# Patient Record
Sex: Female | Born: 1965
Health system: Southern US, Community
[De-identification: ages and names within clinical notes are randomized; demographics above are authoritative.]

## PROBLEM LIST (undated history)

## (undated) HISTORY — PX: WISDOM TOOTH EXTRACTION: SHX21

## (undated) HISTORY — PX: HERNIA REPAIR: SHX51

---

## 2011-08-01 ENCOUNTER — Ambulatory Visit (INDEPENDENT_AMBULATORY_CARE_PROVIDER_SITE_OTHER): Payer: BC Managed Care – PPO | Admitting: Medical

## 2011-08-01 ENCOUNTER — Encounter: Payer: Self-pay | Admitting: Medical

## 2011-08-01 DIAGNOSIS — R079 Chest pain, unspecified: Secondary | ICD-10-CM

## 2011-08-01 NOTE — Progress Notes (Signed)
Subjective:   HPI  Tara Armstrong is a 46 y.o. female who presents as a new patient today.  Moved from [place] 9 mo ago.    She is here for c/o chest pain.  Awoke in the middle of the night few nights ago with chest pain, in middle of chest.  Lasted few minutes, then she fell back to sleep.  Only happened this one time, no other chest pain.  Denies associated SOB, sweaty, nausea, numbness, tingling, radiating pain.   Was brief, went away and hasn't happened again.  She does notes that she had steak that night for supper.  Denies hx/o GERD, doesn't note heartburn in general.  Denies anxiety.    No other aggravating or relieving factors.    No other c/o.  The following portions of the patient's history were reviewed and updated as appropriate: allergies, current medications, past family history, past medical history, past social history, past surgical history and problem list.  No Known Allergies  Current Outpatient Prescriptions on File Prior to Visit  Medication Sig Dispense Refill  . calcium citrate-vitamin D (CITRACAL+D) 315-200 MG-UNIT per tablet Take 1 tablet by mouth 2 (two) times daily.        History reviewed. No pertinent past medical history.  Past Surgical History  Procedure Date  . Wisdom tooth extraction   . Hernia repair     R inguinal; L inguinal    Family History  Problem Relation Age of Onset  . Heart disease Mother 60    MI  . Parkinsonism Father   . Ulcers Brother     History   Social History  . Marital Status: Married    Spouse Name: N/A    Number of Children: N/A  . Years of Education: N/A   Occupational History  . homemaker    Social History Main Topics  . Smoking status: Never Smoker   . Smokeless tobacco: Not on file  . Alcohol Use: No  . Drug Use: No  . Sexually Active: Not on file   Other Topics Concern  . 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    Objective:   Physical Exam  General appearance: alert, no distress, WD/WN, lean white female Oral cavity: MMM, no lesions Neck: supple, no lymphadenopathy, no thyromegaly, no masses, no JVD Heart: RRR, normal S1, S2, no murmurs Lungs: CTA bilaterally, no wheezes, rhonchi, or rales Abdomen: +bs, soft, non tender, non distended, no masses, no hepatomegaly, no splenomegaly Pulses: 2+ symmetric, upper and lower extremities, normal cap refill Ext: no edema   Adult ECG Report  Indication: chest pain  Rate: 51bpm  Rhythm: sinus bradycardia  QRS Axis: 40 degrees  PR Interval:  QRS Duration: 88ms  QTc:  Conduction Disturbances: RSR in V1, V2  Other Abnormalities: none  Patient's cardiac risk factors are: none.  EKG comparison: none  Narrative Interpretation: sinus bradycardia, right ventricular conduction delay   Assessment and Plan :     Encounter Diagnosis  Name Primary?  . Chest pain Yes   Discussed her concerns.  Advised that her symptoms are nonspecific - could be heartburn, indigestion, GERD, cardiac, but she seems low risk for cardiac disease.  However, because she is approaching age 83yo and is interested  in baseline stress testing, she will discuss with husband, and would like to be referred for baseline cardiac screening.  She will return for fasting baseline labs.

## 2011-08-02 ENCOUNTER — Telehealth: Payer: Self-pay | Admitting: Internal Medicine

## 2011-08-02 NOTE — Telephone Encounter (Signed)
Pt would like to go ahead and do the baseline test screening. She wants to know what she needs to do to set that up

## 2011-08-04 ENCOUNTER — Other Ambulatory Visit: Payer: Self-pay | Admitting: Medical

## 2011-08-04 NOTE — Telephone Encounter (Signed)
Refer to Dr. Donnie Aho for baseline cardiac screening/stress test and recent chest pain.  Have her come in as discussed for baseline fasting screening labs too.  They should be in the computer (CMET, CBC w/ diff, Lipid, TSH, UA, CRP)

## 2011-08-05 NOTE — Telephone Encounter (Signed)
PATIENT WAS NOTIFIED OF HER APPOINTMENT WITH DR. TILLEY 08/12/11 @ 145PM. Tara Armstrong ALSO MADE HER APPOINTMENT TO SEE SHANE ON 08/12/11 @ 100 AM. CLS

## 2011-08-12 ENCOUNTER — Encounter: Payer: Self-pay | Admitting: Medical

## 2011-08-12 ENCOUNTER — Ambulatory Visit (INDEPENDENT_AMBULATORY_CARE_PROVIDER_SITE_OTHER): Payer: BC Managed Care – PPO | Admitting: Medical

## 2011-08-12 DIAGNOSIS — R079 Chest pain, unspecified: Secondary | ICD-10-CM

## 2011-08-12 LAB — COMPREHENSIVE METABOLIC PANEL
ALT: 9 U/L (ref 0–35)
CO2: 27 mEq/L (ref 19–32)
Calcium: 9.5 mg/dL (ref 8.4–10.5)
Chloride: 108 mEq/L (ref 96–112)
Creat: 0.98 mg/dL (ref 0.50–1.10)
Glucose, Bld: 80 mg/dL (ref 70–99)
Total Bilirubin: 0.4 mg/dL (ref 0.3–1.2)

## 2011-08-12 LAB — CBC WITH DIFFERENTIAL/PLATELET
Basophils Absolute: 0 10*3/uL (ref 0.0–0.1)
HCT: 40.5 % (ref 36.0–46.0)
Lymphocytes Relative: 34 % (ref 12–46)
Lymphs Abs: 2 10*3/uL (ref 0.7–4.0)
Monocytes Absolute: 0.5 10*3/uL (ref 0.1–1.0)
Neutro Abs: 3.4 10*3/uL (ref 1.7–7.7)
Platelets: 195 10*3/uL (ref 150–400)
RBC: 4.4 MIL/uL (ref 3.87–5.11)
RDW: 12.8 % (ref 11.5–15.5)
WBC: 5.9 10*3/uL (ref 4.0–10.5)

## 2011-08-12 LAB — HIGH SENSITIVITY CRP: CRP, High Sensitivity: 0.9 mg/L

## 2011-08-12 LAB — LIPID PANEL
HDL: 51 mg/dL (ref >39)
Total CHOL/HDL Ratio: 3.2 Ratio
Triglycerides: 103 mg/dL (ref <150)

## 2011-08-12 NOTE — Progress Notes (Signed)
Here for nurse visit labs only.  She has f/u with cardiology Dr. Donnie Aho today for screening.

## 2011-08-29 ENCOUNTER — Encounter: Payer: Self-pay | Admitting: Cardiology

## 2011-10-28 ENCOUNTER — Other Ambulatory Visit: Payer: Self-pay | Admitting: Obstetrics and Gynecology

## 2011-10-28 DIAGNOSIS — R928 Other abnormal and inconclusive findings on diagnostic imaging of breast: Secondary | ICD-10-CM

## 2011-10-30 ENCOUNTER — Ambulatory Visit
Admission: RE | Admit: 2011-10-30 | Discharge: 2011-10-30 | Disposition: A | Discharge status: Home / Self Care | Payer: BC Managed Care – PPO | Source: Ambulatory Visit | Attending: Obstetrics and Gynecology | Admitting: Obstetrics and Gynecology

## 2011-10-30 DIAGNOSIS — R928 Other abnormal and inconclusive findings on diagnostic imaging of breast: Secondary | ICD-10-CM

## 2012-11-04 ENCOUNTER — Other Ambulatory Visit: Payer: Self-pay | Admitting: Obstetrics and Gynecology

## 2012-11-04 DIAGNOSIS — R928 Other abnormal and inconclusive findings on diagnostic imaging of breast: Secondary | ICD-10-CM

## 2012-11-20 ENCOUNTER — Ambulatory Visit
Admission: RE | Admit: 2012-11-20 | Discharge: 2012-11-20 | Disposition: A | Discharge status: Home / Self Care | Payer: BC Managed Care – PPO | Source: Ambulatory Visit | Attending: Obstetrics and Gynecology | Admitting: Obstetrics and Gynecology

## 2012-11-20 DIAGNOSIS — R928 Other abnormal and inconclusive findings on diagnostic imaging of breast: Secondary | ICD-10-CM

## 2013-01-04 ENCOUNTER — Ambulatory Visit: Payer: Self-pay | Admitting: Family Medicine

## 2014-02-18 ENCOUNTER — Ambulatory Visit (INDEPENDENT_AMBULATORY_CARE_PROVIDER_SITE_OTHER): Payer: BC Managed Care – PPO | Admitting: Medical

## 2014-02-18 ENCOUNTER — Encounter: Payer: Self-pay | Admitting: Medical

## 2014-02-18 VITALS — BP 100/70 | HR 72 | Temp 98.5°F | Wt 178.0 lb

## 2014-02-18 DIAGNOSIS — J029 Acute pharyngitis, unspecified: Secondary | ICD-10-CM

## 2014-02-18 DIAGNOSIS — R059 Cough, unspecified: Secondary | ICD-10-CM

## 2014-02-18 DIAGNOSIS — R05 Cough: Secondary | ICD-10-CM

## 2014-02-18 DIAGNOSIS — J329 Chronic sinusitis, unspecified: Secondary | ICD-10-CM

## 2014-02-18 DIAGNOSIS — J40 Bronchitis, not specified as acute or chronic: Secondary | ICD-10-CM

## 2014-02-18 MED ORDER — HYDROCODONE-HOMATROPINE 5-1.5 MG/5ML PO SYRP: 5.0000 mL | ORAL_SOLUTION | Freq: Three times a day (TID) | ORAL | Status: DC | PRN

## 2014-02-18 MED ORDER — AZITHROMYCIN 250 MG PO TABS: ORAL_TABLET | ORAL | Status: DC

## 2014-02-18 NOTE — Patient Instructions (Signed)
  Thank you for giving me the opportunity to serve you today.    Your diagnosis today includes: Encounter Diagnoses  Name Primary?  . Cough Yes  . Sore throat   . Sinobronchitis      Specific recommendations today include:  Increase water intake  Begin Mucinex DM or Robitussin-DM for cough and congestion during the day  You may use Hycodan prescription cough syrup at bedtime or every 6 hours as long nausea or not driving since it can cause sedation  Begin Z-Pak antibiotic 5 days  Rest  If not much improved within the next 4-5 days let me know  Return if symptoms worsen or fail to improve.

## 2014-02-18 NOTE — Progress Notes (Signed)
Subjective:  Tara Armstrong is a 48 y.o. female who presents for upper respiratory symptoms.   Symptoms include sore throat, headache, cough, at a fever a few weeks ago but this resolved and she actually notes having these symptoms even since September.  Has had tender lymph nodes in her neck since yesterday. Headaches have returned again.  Initially in September had sore throat and headache, thought was a cold.  Would seem to improve but would flare up again.  Denies wheezing, shortness of breath, nausea vomiting diarrhea, sinus pressure no nasal congestion or drainage no ear pain.  Treatment to date: cough suppressants, decongestants and ibuprofen.  +sick contacts.   She does not smoke.   She does not have a history of bronchitis.   No other aggravating or relieving factors.  No other c/o.  The following portions of the patient's history were reviewed and updated as appropriate: allergies, current medications, past family history, past medical history, past social history, past surgical history and problem list.  History reviewed. No pertinent past medical history.  ROS as in subjective   Objective: BP 100/70 mmHg  Pulse 72  Temp(Src) 98.5 F (36.9 C) (Oral)  Wt 178 lb (80.74 kg)  SpO2 98%   General appearance: Alert, WD/WN, no distress,fatigued-appearing                             Skin: warm, no rash, no diaphoresis                           Head: no sinus tenderness                            Eyes: conjunctiva normal, corneas clear, PERRLA                            Ears: pearly TMs, external ear canals normal                          Nose: septum midline, turbinates swollen,right greater than left, with erythema and mucoid discharge             Mouth/throat: MMM, tongue normal, mild pharyngeal erythema, questionable right tonsillar exudate and mild tonsil swelling right                           Neck: supple, shoddy tender anterior nodes, no thyromegaly, nontender      Heart: RRR, normal S1, S2, no murmurs                         Lungs: +bronchial breath sounds, decreased bilat lower fields, but no obvious consolidation, no rhonchi, no wheezes, no rales                Extremities: no edema, nontender     Assessment: Encounter Diagnoses  Name Primary?  . Cough Yes  . Sore throat   . Sinobronchitis       Plan:  Medication orders today include: Zpak, hycodan syrup  Specific recommendations today include:  Increase water intake  Begin Mucinex DM or Robitussin-DM for cough and congestion during the day  You may use Hycodan prescription cough syrup at bedtime or every 6 hours as long nausea or  not driving since it can cause sedation  Begin Z-Pak antibiotic 5 days  Rest  If not much improved within the next 4-5 days let me know  F/u prn.

## 2014-05-02 LAB — HM COLONOSCOPY

## 2014-10-26 ENCOUNTER — Ambulatory Visit (INDEPENDENT_AMBULATORY_CARE_PROVIDER_SITE_OTHER): Payer: BC Managed Care – PPO | Admitting: Family Medicine

## 2014-10-26 ENCOUNTER — Ambulatory Visit
Admission: RE | Admit: 2014-10-26 | Discharge: 2014-10-26 | Disposition: A | Discharge status: Home / Self Care | Payer: BC Managed Care – PPO | Source: Ambulatory Visit | Attending: Family Medicine | Admitting: Family Medicine

## 2014-10-26 ENCOUNTER — Encounter: Payer: Self-pay | Admitting: Family Medicine

## 2014-10-26 VITALS — BP 118/70 | HR 60 | Temp 98.7°F | Ht 69.0 in | Wt 187.6 lb

## 2014-10-26 DIAGNOSIS — R053 Chronic cough: Secondary | ICD-10-CM

## 2014-10-26 DIAGNOSIS — R05 Cough: Secondary | ICD-10-CM | POA: Diagnosis not present

## 2014-10-26 DIAGNOSIS — J069 Acute upper respiratory infection, unspecified: Secondary | ICD-10-CM

## 2014-10-26 MED ORDER — ALBUTEROL SULFATE 108 (90 BASE) MCG/ACT IN AEPB: 2.0000 | INHALATION_SPRAY | Freq: Four times a day (QID) | RESPIRATORY_TRACT | Status: DC | PRN

## 2014-10-26 NOTE — Progress Notes (Signed)
Chief Complaint  Patient presents with  . Cough    since November. Her left ear also feels like it needs to pop. Non productive.    She gets a cough every winter, but this time it never went away. She has been coughing since November. Her family jokes that she is 'allergic to winter".  Starts like a tickle in the throat, like she needs to clear something.  Cough is nonproductive.  No increase cough with exercise, not related to eating.  Worse with talking and laughing.  Denies any heartburn. No associated runny nose, sneezing, eye symptoms.  Cough is not worse at night.  She started with a head cold in the last 2 days--ear plugging, sinus drainage,postnasal drainage and cough has gotten worse related to this.  Nasal mucus is slightly yellow.  She has been taking an OTC cold and cough medication, which helps some with the congestion. She had low grade fever of 99.9 yesterday.  No sick contacts.  PMH, PSH, SH were reviewed/updated  Outpatient Encounter Prescriptions as of 10/26/2014  Medication Sig  . calcium citrate-vitamin D (CITRACAL+D) 315-200 MG-UNIT per tablet Take 1 tablet by mouth 2 (two) times daily.  . [DISCONTINUED] azithromycin (ZITHROMAX) 250 MG tablet 2 tablets day 1, then 1 tablet days 2-4  . [DISCONTINUED] HYDROcodone-homatropine (HYCODAN) 5-1.5 MG/5ML syrup Take 5 mLs by mouth every 8 (eight) hours as needed for cough.   No facility-administered encounter medications on file as of 10/26/2014.   No Known Allergies  ROS:  Denies chills, nausea, vomiting, diarrhea, chest pain, heartburn, bleeding, bruising, rashes.  Denies dizziness. Some sinus headaches in the last couple of days.  Denies shortness of breath.  See HPI  PHYSICAL EXAM: BP 118/70 mmHg  Pulse 60  Temp(Src) 98.7 F (37.1 C) (Tympanic)  Ht 5\' 9"  (1.753 m)  Wt 187 lb 9.6 oz (85.095 kg)  BMI 27.69 kg/m2  LMP 11/19/2012 Well developed, pleasant female, with frequent dry, hacky cough during visit. HEENT: PERRL,  EOMI, conjunctiva clear. TM's and EAC's are normal. Nasal mucosa is mod-severely edematous, slightly pink, with clear mucus Mild erythema posteriorly in OP Neck: no lymphadenopathy, thyromegaly or mass Heart: regular rate and rhythm without murmur Lungs: clear--can only take normal breaths--deep breaths triggered coughing spell. No wheezing, rales or ronchi appreciated Skin: no rash Abdomen: soft, nontender Extremities: no edema Neuro: alert and oriented, cranial nerves intact, normal gait Psych: normal mood, affect, hygiene and grooming  ASSESSMENT/PLAN:  Chronic cough - dry cough since November, exacerbated by current URI - Plan: DG Chest 2 View, Albuterol Sulfate (PROAIR RESPICLICK) 108 (90 BASE) MCG/ACT AEPB, DISCONTINUED: Albuterol Sulfate (PROAIR RESPICLICK) 108 (90 BASE) MCG/ACT AEPB  Acute upper respiratory infection - supportive measures reviewed; no bacterial infection  Ddx of cough reveiwed, including RAD, GERD, infection, malignancy, allergies/PND.  [facility] at your convenience for chest x-ray [phone](301 or [address] We are recommending this due to the length of cough that you have had. You clearly also have a cold right now, that is making things worse. Ultimately, I think you might need pulmonary function tests (we do in the office) but now when you have a cold.  This will help look for reactive airway disease/asthma Drink plenty of water. Use Guaifenesin (expectorant found in Mucinex, Robitussin products) to loosen up the mucus/phlegm in the sinuses, throat and chest. Use decongestants (such as pseudoephedrine or phenylephrine) to dry up the drainage and help with sinus pain/pressure. Dextromethorphan is a cough suppressant (found  in many OTC medications, "DM", and is found separately in Delsym syrup).  We discussed Tessalon Perles (benzonatate) as another cough suppressant that is prescription only--very good for dry hacky coughs.  Let us know if you need  this.  For the chronic cough, the differential diagnoses we discussed included allergies, asthma and reflux. We discussed treating all three, and then backing down on the things we think are less likely contributing. The medications would include zyrtec/claritin/allegra (plain or the D versions especially if you are having ongoing congestion)--this is for cold/allergy symptoms. Prilosec OTC or Nexium 24 hr (these are over-the-counter medications for acid reflux). And for the asthma possibility we either use an albuterol inhaler, vs using oral steroids (which have more side effects).  Today we are prescribing albuterol (inhaler) to use every 6 hours as needed for dry cough/tightness/shortness of breath.

## 2014-10-26 NOTE — Patient Instructions (Signed)
  Go [facility] at your convenience for chest x-ray [phone] or [phone]). We are recommending this due to the length of cough that you have had. You clearly also have a cold right now, that is making things worse. Ultimately, I think you might need pulmonary function tests (we do in the office) but now when you have a cold.  This will help look for reactive airway disease/asthma Drink plenty of water. Use Guaifenesin (expectorant found in Mucinex, Robitussin products) to loosen up the mucus/phlegm in the sinuses, throat and chest. Use decongestants (such as pseudoephedrine or phenylephrine) to dry up the drainage and help with sinus pain/pressure. Dextromethorphan is a cough suppressant (found in many OTC medications, "DM", and is found separately in Delsym syrup).  We discussed Tessalon Perles (benzonatate) as another cough suppressant that is prescription only--very good for dry hacky coughs.  Let us know if you need this.  For the chronic cough, the differential diagnoses we discussed included allergies, asthma and reflux. We discussed treating all three, and then backing down on the things we think are less likely contributing. The medications would include zyrtec/claritin/allegra (plain or the D versions especially if you are having ongoing congestion)--this is for cold/allergy symptoms. Prilosec OTC or Nexium 24 hr (these are over-the-counter medications for acid reflux). And for the asthma possibility we either use an albuterol inhaler, vs using oral steroids (which have more side effects).  Today we are prescribing albuterol (inhaler) to use every 6 hours as needed for dry cough/tightness/shortness of breath.

## 2014-11-09 ENCOUNTER — Encounter: Payer: Self-pay | Admitting: Family Medicine

## 2014-11-09 ENCOUNTER — Ambulatory Visit (INDEPENDENT_AMBULATORY_CARE_PROVIDER_SITE_OTHER): Payer: BC Managed Care – PPO | Admitting: Family Medicine

## 2014-11-09 VITALS — BP 108/72 | HR 64 | Ht 69.0 in | Wt 185.4 lb

## 2014-11-09 DIAGNOSIS — R05 Cough: Secondary | ICD-10-CM

## 2014-11-09 DIAGNOSIS — H9192 Unspecified hearing loss, left ear: Secondary | ICD-10-CM | POA: Diagnosis not present

## 2014-11-09 DIAGNOSIS — H6982 Other specified disorders of Eustachian tube, left ear: Secondary | ICD-10-CM | POA: Diagnosis not present

## 2014-11-09 DIAGNOSIS — R059 Cough, unspecified: Secondary | ICD-10-CM

## 2014-11-09 DIAGNOSIS — H6992 Unspecified Eustachian tube disorder, left ear: Secondary | ICD-10-CM

## 2014-11-09 DIAGNOSIS — J309 Allergic rhinitis, unspecified: Secondary | ICD-10-CM

## 2014-11-09 MED ORDER — FLUTICASONE PROPIONATE 50 MCG/ACT NA SUSP: 2.0000 | Freq: Every day | NASAL | Status: DC

## 2014-11-09 MED ORDER — METHYLPREDNISOLONE 4 MG PO TBPK: ORAL_TABLET | ORAL | Status: DC

## 2014-11-09 NOTE — Patient Instructions (Signed)
   Take the steroids as directed. Start taking flonase--2 sprays into each nostril once daily.  Use gentle sniffs only.   I recommend taking an antihistamine such as claritin, zyrtec or allegra daily. You may continue to use the Tylenol Cold and sinus for now, short-term.  Ultimately, if you continue to need additional decongestant (along with the flonase and claritin), then switch to a "D" version of the antihistamine (ie Claritin D, Zyrtec-D).   Call us if your hearing in the left ear doesn't not improve, so we can refer you to ENT for further evaluation.  If you develop ear pain, fever, please return here first for re-evaluation.

## 2014-11-09 NOTE — Progress Notes (Signed)
Chief Complaint  Patient presents with  . Cough    follow up on cough, getting better.   She was seen 2 weeks ago with complaint of chronic cough since November, but also had acute URI symptoms at that time.   The more chronic cough was nonproductive, started as a tickle in throat, and was worse with talking and laughing, not with exercise.  She had CXR which showed: Subsegmental atelectasis in the right infrahilar region possibly reflecting acute bronchitis. There is no alveolar pneumonia nor CHF.  She was given albuterol inhaler at last visit. We also discussed reflux, allergies, and various treatments for all of these conditions.  She presents today to follow up on the cough.  She used the inhaler a few times, and it didn't seem to help with the cough at all.  She went back to taking Hycodan syrup she had left at home.  She has also been taking Tylenol Cold and Cough, which helped with the head congestion.  She reports her cough is better.  She is aware of more postnasal drainage than at last visit, runny nose/sniffling, moreso than 2 weeks ago.  She still cannot hear out of her left ear.  Denies pain.  Sounds echoey; not popping.  She recalls having had a similar problem in the past, and once her ear popped the hearing returned.  This is lasting longer, ear hasn't popped. She denies any ear pain.  She last used Tylenol Cold and Sinus 2 days ago, as she started feeling better. Mucinex upset her stomach so wasn't able to take it. Never tried any PPI/reflux treatment. Denies heartburn.  PMH, PSH, SH reviewed  Outpatient Encounter Prescriptions as of 11/09/2014  Medication Sig Note  . calcium citrate-vitamin D (CITRACAL+D) 315-200 MG-UNIT per tablet Take 1 tablet by mouth 2 (two) times daily.   . Albuterol Sulfate (PROAIR RESPICLICK) 108 (90 BASE) MCG/ACT AEPB Inhale 2 puffs into the lungs every 6 (six) hours as needed (cough/wheezing/shortness of breath). (Patient not taking: Reported on  11/09/2014) 11/09/2014: Used 3 times and didn't notice improvement   No facility-administered encounter medications on file as of 11/09/2014.   No Known Allergies  ROS:  No fever, chills, nausea, vomiting, diarrhea, shortness of breath. No headache, dizziness, chest pain, ear pain, diarrhea, bleeding, bruising, rash or other complaints.  See HPI.  PHYSICAL EXAM: BP 108/72 mmHg  Pulse 64  Ht 5\' 9"  (1.753 m)  Wt 185 lb 6.4 oz (84.097 kg)  BMI 27.37 kg/m2  LMP 11/19/2012  Well developed, pleasant female with frequent sniffling, and only occasional dry cough HEENT: PERRL, EOMI, conjunctiva clear. TM's and EAC's normal (if anything, slight effusion on the RIGHT; completely normal on the left).  Nasal mucosa is mod-severely edematous, with clear mucus.  OP is clear. Decreased hearing on the left (can barely hear fingers rubbing; normal on the right). Neck: no lymphadenopathy or mass Heart: regular rate and rhythm without murmur Lungs: clear bilaterally Neuro: alert and oriented, cranial nerves intact, normal strength, gait Psych: normal mood, affect, hygiene and grooming Skin: no rash  ASSESSMENT/PLAN:  Left ear hearing loss - related to ETD vs viral. treat with steroids--f/u with ENT if hearing doesn't improve.  Risks/side effects reviewed. - Plan: methylPREDNISolone (MEDROL DOSEPAK) 4 MG TBPK tablet  Allergic rhinitis, unspecified allergic rhinitis type - discussed proper use of nasal steroids, antihistamines - Plan: methylPREDNISolone (MEDROL DOSEPAK) 4 MG TBPK tablet, fluticasone (FLONASE) 50 MCG/ACT nasal spray  Eustachian tube dysfunction, left  Cough -  improving   Left hearing loss--suspect is related to ETD but cannot r/o viral cause.  Treat with steroid course. Likely has allergies contributing to her symptoms (PND, ETD and cough).  Start Flonase and daily antihistamine. Continue decongestants prn.  Risks/side effects reviewed.  Refer to ENT if hearing does not return  normally.    Take the steroids as directed. Start taking flonase--2 sprays into each nostril once daily.  Use gentle sniffs only.   I recommend taking an antihistamine such as claritin, zyrtec or allegra daily. You may continue to use the Tylenol Cold and sinus for now, short-term.  Ultimately, if you continue to need additional decongestant (along with the flonase and claritin), then switch to a "D" version of the antihistamine (ie Claritin D, Zyrtec-D).   Call us if your hearing in the left ear doesn't not improve, so we can refer you to ENT for further evaluation.  If you develop ear pain, fever, please return here first for re-evaluation.

## 2014-11-14 ENCOUNTER — Telehealth: Payer: Self-pay | Admitting: Family Medicine

## 2014-11-14 NOTE — Telephone Encounter (Signed)
Okay to refer to ENT for hearing loss 

## 2014-11-14 NOTE — Telephone Encounter (Signed)
Please call patient She states Dr. Lynelle Doctor saw her recently and told her to call back for ENT referral for hearing problems L ear

## 2014-11-15 NOTE — Telephone Encounter (Signed)
Faxed referral to [facility]

## 2015-08-01 ENCOUNTER — Ambulatory Visit (INDEPENDENT_AMBULATORY_CARE_PROVIDER_SITE_OTHER): Payer: BC Managed Care – PPO | Admitting: Podiatry

## 2015-08-01 ENCOUNTER — Ambulatory Visit (INDEPENDENT_AMBULATORY_CARE_PROVIDER_SITE_OTHER): Payer: BC Managed Care – PPO

## 2015-08-01 ENCOUNTER — Encounter: Payer: Self-pay | Admitting: Podiatry

## 2015-08-01 VITALS — BP 125/72 | HR 64 | Resp 16

## 2015-08-01 DIAGNOSIS — M7662 Achilles tendinitis, left leg: Secondary | ICD-10-CM

## 2015-08-01 DIAGNOSIS — M722 Plantar fascial fibromatosis: Secondary | ICD-10-CM

## 2015-08-01 MED ORDER — MELOXICAM 15 MG PO TABS: 15.0000 mg | ORAL_TABLET | Freq: Every day | ORAL | Status: DC

## 2015-08-01 MED ORDER — METHYLPREDNISOLONE 4 MG PO TBPK: ORAL_TABLET | ORAL | Status: DC

## 2015-08-01 NOTE — Progress Notes (Signed)
   Subjective:    Patient ID: Tara Armstrong, female    DOB: 1965/10/26, 50 y.o.   MRN: [id number]  HPI: She presents today with a 2 week history of pain to her left posterior heel. She relates some pain to the plantar heel but not much. She states that this started as she was doing excessive walking while at school. She is a Research officer, trade unionkindergarten instructor. She's tried icing and ibuprofen with some help.    Review of Systems  Musculoskeletal: Positive for gait problem.  Allergic/Immunologic: Positive for environmental allergies.  All other systems reviewed and are negative.      Objective:   Physical Exam: Vital signs are stable she's alert and oriented 3. Pulses are strongly palpable. Vital signs are stable alert and oriented 3. Neurologic sensorium is intact. Deep tendon reflexes are intact and muscle strength is normal. Orthopedic evaluation demonstrates pain to palpation medial calcaneal tubercle of the left heel as well as pain on palpation of the insertion site of the Achilles tendon left heel. Radiographs taken in the office today do not demonstrate any type of osseus abnormalities other than plantar distally oriented calcaneal heel spur and a soft tissue decrease in density at the plantar fascial calcaneal insertion site. I see no signs of Achilles tendinitis at this point. Cutaneous evaluation demonstrates supple well-hydrated cutis no erythema edema cellulitis drainage or odor.        Assessment & Plan:  Plantar fasciitis resulting in Achilles tendinitis left foot.  Plan: Start her on a Medrol Dosepak followed by meloxicam. Injected her left plantar heel today with Kenalog and local anesthetic. Placed her in a plantar fascial brace and a night splint. We discussed appropriate's shoe gear modifications ice therapy. I will follow-up with her in 1 month. We may need to inject her posterior heel left next visit

## 2015-08-01 NOTE — Patient Instructions (Addendum)

## 2015-08-03 ENCOUNTER — Ambulatory Visit: Payer: BC Managed Care – PPO | Admitting: Podiatry

## 2015-08-29 ENCOUNTER — Ambulatory Visit: Payer: BC Managed Care – PPO | Admitting: Podiatry

## 2015-10-05 ENCOUNTER — Ambulatory Visit (INDEPENDENT_AMBULATORY_CARE_PROVIDER_SITE_OTHER): Payer: BC Managed Care – PPO | Admitting: Podiatry

## 2015-10-05 ENCOUNTER — Encounter: Payer: Self-pay | Admitting: Podiatry

## 2015-10-05 VITALS — BP 123/86 | HR 69 | Resp 12

## 2015-10-05 DIAGNOSIS — M722 Plantar fascial fibromatosis: Secondary | ICD-10-CM

## 2015-10-05 MED ORDER — MELOXICAM 15 MG PO TABS: 15.0000 mg | ORAL_TABLET | Freq: Every day | ORAL | Status: DC

## 2015-10-05 NOTE — Progress Notes (Signed)
She persisted A for follow-up of her left heel pain she states that she is approximately 70-80% improved but is still tender. She states that she really continues to take the meloxicam a regular basis.  Objective: Vital signs are stable she is alert and oriented 3. Pulses are palpable. Neurologic sensorium is intact. She has pain on palpation medial tubercle of the left heel but minimally so.  Assessment: Residual plantar fasciitis and heel.  Plan: I reinjected her left heel today with Kenalog and local anesthetic encouraged her to continue to take her Alesse Camel regular basis and continue all other conservative therapies I will follow-up with her in 4-6 weeks for reevaluation should this be necessary. Orthotics will be her next option.

## 2015-11-16 ENCOUNTER — Ambulatory Visit: Payer: BC Managed Care – PPO | Admitting: Podiatry

## 2015-11-16 ENCOUNTER — Encounter: Payer: Self-pay | Admitting: Family Medicine

## 2015-11-16 ENCOUNTER — Ambulatory Visit (INDEPENDENT_AMBULATORY_CARE_PROVIDER_SITE_OTHER): Payer: BC Managed Care – PPO | Admitting: Family Medicine

## 2015-11-16 VITALS — BP 110/70 | HR 72 | Ht 69.0 in | Wt 185.2 lb

## 2015-11-16 DIAGNOSIS — J302 Other seasonal allergic rhinitis: Secondary | ICD-10-CM

## 2015-11-16 DIAGNOSIS — R1310 Dysphagia, unspecified: Secondary | ICD-10-CM

## 2015-11-16 DIAGNOSIS — R0789 Other chest pain: Secondary | ICD-10-CM | POA: Diagnosis not present

## 2015-11-16 MED ORDER — FLUTICASONE PROPIONATE 50 MCG/ACT NA SUSP: 1.0000 | Freq: Every day | NASAL | 11 refills | Status: DC

## 2015-11-16 MED ORDER — CETIRIZINE HCL 10 MG PO TABS: 10.0000 mg | ORAL_TABLET | Freq: Every day | ORAL | 11 refills | Status: DC

## 2015-11-16 MED ORDER — OMEPRAZOLE MAGNESIUM 20 MG PO TBEC: 20.0000 mg | DELAYED_RELEASE_TABLET | Freq: Every day | ORAL | 0 refills | Status: DC

## 2015-11-16 NOTE — Patient Instructions (Signed)
     Chest pain--sounds like esophageal spasm given occurring while eating. Since you are currently taking NSAIDs (mobic), let's try prilosec OTC daily for 2 weeks in case this is contributing. You may also have component of postnasal drainage hanging things up in the chest as well. Restart zyrtec and/or flonase, consider mucinex  Refer for Barium swallow to evaluate for dysmotility or spasm.--Suzette Battiest will schedule. May need referral to GI if not improving despite these measures.

## 2015-11-16 NOTE — Progress Notes (Signed)
Chief Complaint  Patient presents with  . Heartburn    has been experiencing for about 2 weeks and was really bad yesterday.     2 weeks ago she had a few episodes of discomfort in her mid chest. This occurred while eating--thought maybe she ate too fast. She started slowing down, smaller portions, chewing food better, but had another episode, also while eating.  She felt like food was getting hung up, but was able to swallow it.  Yesterday she had a more intense pain between her breasts, and spread across the whole chest, bilaterally.  Also occurred while eating--rice and enchilada (not spicy). Can't recall the foods she ate with the other episodes. Had pizza today without a problem.  Yesterday the pain lasted the longest--a few minutes. Was able to talk through it, tried to cough, didn't help. Otherwise hasn't had any problems with heartburn, belching. Denies change in diet--no change in caffeine, spicy foods, chocolate, peppermint.  Allergies--requesting refills on zyrtec and flonase (has rx card, so benefits from rx for these OTC medications).  She uses them seasonally with good result (not currently taking). Denies current allergy symptoms.  She saw Dr. Donnie Aho in 2013 for chest pain. She had a stress test in 08/2011, normal (see Media section).  Denies other chest pain, except as stated above.  No shortness of breath or exertional symptoms.  Less exercise recently due to PF and achilles tendinitis. No cough, wheezing.   PMH, PSH, SH reviewed  Outpatient Encounter Prescriptions as of 11/16/2015  Medication Sig Note  . Calcium Carbonate-Vitamin D3 (CALCIUM 600-D) 600-400 MG-UNIT TABS Take 2 tablets by mouth daily.   . meloxicam (MOBIC) 15 MG tablet Take 1 tablet (15 mg total) by mouth daily.   Marland Kitchen aspirin 81 MG tablet Take 81 mg by mouth daily.   . cetirizine (ZYRTEC ALLERGY) 10 MG tablet Take 1 tablet (10 mg total) by mouth daily.   . fluticasone (FLONASE) 50 MCG/ACT nasal spray Place  1-2 sprays into both nostrils daily.   Marland Kitchen omeprazole (PRILOSEC OTC) 20 MG tablet Take 1 tablet (20 mg total) by mouth daily.   . [DISCONTINUED] Cetirizine HCl (ZYRTEC ALLERGY PO) Take by mouth.   . [DISCONTINUED] fluticasone (FLONASE) 50 MCG/ACT nasal spray Place 1 spray into both nostrils daily. 11/16/2015: Uses seasonally  . [DISCONTINUED] meloxicam (MOBIC) 15 MG tablet Take 1 tablet (15 mg total) by mouth daily.   . [DISCONTINUED] methylPREDNISolone (MEDROL) 4 MG TBPK tablet Tapering 6 day dose pack   . [DISCONTINUED] omeprazole (PRILOSEC OTC) 20 MG tablet Take 1 tablet (20 mg total) by mouth daily.    No facility-administered encounter medications on file as of 11/16/2015.    (prilosec rx'd today, not prior to visit)  No Known Allergies  ROS: no fever, chills, headaches, dizziness, cough, shortness of breath, URI symptoms, allergies not flaring.  No nausea, vomiting. Bowels are fairly normal, not quite as regular recently. No bleeding, bruising, rash, mood changes or other concerns, except as noted in HPI  PHYSICAL EXAM: BP 110/70 (BP Location: Left Arm, Patient Position: Sitting, Cuff Size: Normal)   Pulse 72   Ht  (1.753 m)   Wt 185 lb 3.2 oz (84 kg)   LMP 11/19/2012   BMI 27.35 kg/m   Well developed, well-appearing female in no distress. She appears comfortable, and currently denies discomfort HEENT: PERRL, EOMI, conjunctiva and sclera are clear. Nasal mucosa is mod edematous, no erythema or purulence OP with thick white mucus noted posteriorly  Sinuses nontender Neck: no lymphadenopathy or mass Heart: regular rate and rhythm, no murmur Chest wall nontender Lungs: clear bilaterally Abdomen: soft, nontender, no mass or organomegaly Extremiites: no edema Skin: normal turgor, no rash Psych: normal mood, affect, hygiene and grooming Neuro: alert and oriented, normal cranial nerves, strength, gait  EKG:  Sinus brady. Poss old septal infarct. No acute abnormalities Review of  chart--see notes of EKG at visit with Shane in 08/01/11, but cannot find actual EKG for comparison.   ASSESSMENT/PLAN:  Other chest pain - with eating--suspect esophageal spasm or dysmotility. PN and/or esophagitis can be a factor - Plan: DG Esophagus, EKG 12-Lead  Other seasonal allergic rhinitis - asymptomatic, but evidence of inflammation and drainage on exam.  restart meds - Plan: cetirizine (ZYRTEC ALLERGY) 10 MG tablet, fluticasone (FLONASE) 50 MCG/ACT nasal spray  Dysphagia - Plan: DG Esophagus, EKG 12-Lead, omeprazole (PRILOSEC OTC) 20 MG tablet, DISCONTINUED: omeprazole (PRILOSEC OTC) 20 MG tablet   Chest pain--sounds like esophageal spasm given occurring while eating. She is currently taking NSAIDs Trial of prilosec OTC daily for 2 weeks to cover for any contributing factor of esophagitis. May have component of postnasal drainage hanging things up in the chest as well. Restart zyrtec and/or flonase, consider mucinex  Refer for Barium swallow to evaluate for dysmotility or spasm. May need referral to GI if not improving despite these measures.

## 2015-11-20 ENCOUNTER — Ambulatory Visit
Admission: RE | Admit: 2015-11-20 | Discharge: 2015-11-20 | Disposition: A | Discharge status: Home / Self Care | Payer: BC Managed Care – PPO | Source: Ambulatory Visit | Attending: Family Medicine | Admitting: Family Medicine

## 2015-11-20 DIAGNOSIS — R1310 Dysphagia, unspecified: Secondary | ICD-10-CM

## 2015-11-20 DIAGNOSIS — R0789 Other chest pain: Secondary | ICD-10-CM

## 2015-12-12 ENCOUNTER — Ambulatory Visit (INDEPENDENT_AMBULATORY_CARE_PROVIDER_SITE_OTHER): Payer: BC Managed Care – PPO | Admitting: Podiatry

## 2015-12-12 ENCOUNTER — Encounter: Payer: Self-pay | Admitting: Podiatry

## 2015-12-12 DIAGNOSIS — M7662 Achilles tendinitis, left leg: Secondary | ICD-10-CM

## 2015-12-13 NOTE — Progress Notes (Signed)
She presents today for follow-up of Achilles tendinitis of the left lower extremity. She states that the Achilles seems to be constant area of irritation.  Objective: Vital signs are stable she is alert and oriented 3. Pulses are palpable. She still has some tenderness on palpation of the Achilles tendon left. No open lesions or wounds. Pulses are palpable pain.  Assessment: Chronic intractable Achilles tendinitis left  Plan: We are consented her to physical therapy and we also scanned her for orthotics today. I will follow-up with her in 1 month.

## 2016-01-02 ENCOUNTER — Encounter: Payer: Self-pay | Admitting: Podiatry

## 2016-01-02 ENCOUNTER — Ambulatory Visit (INDEPENDENT_AMBULATORY_CARE_PROVIDER_SITE_OTHER): Payer: BC Managed Care – PPO | Admitting: Podiatry

## 2016-01-02 DIAGNOSIS — M7662 Achilles tendinitis, left leg: Secondary | ICD-10-CM

## 2016-01-02 DIAGNOSIS — M722 Plantar fascial fibromatosis: Secondary | ICD-10-CM

## 2016-01-02 NOTE — Progress Notes (Signed)
She presented today to pick up her orthotics. She is provided with both oral and written home-going instructions for the care and use of the orthotics. We will follow-up with her 1 month to reevaluate the Achilles tendinitis and plantar fasciitis.

## 2016-01-02 NOTE — Patient Instructions (Signed)

## 2016-01-30 ENCOUNTER — Ambulatory Visit: Payer: BC Managed Care – PPO | Admitting: Podiatry

## 2016-02-14 ENCOUNTER — Telehealth: Payer: Self-pay | Admitting: *Deleted

## 2016-02-14 DIAGNOSIS — D6851 Activated protein C resistance: Secondary | ICD-10-CM | POA: Insufficient documentation

## 2016-02-14 NOTE — Telephone Encounter (Signed)
I received her labs and put them in your folder.

## 2016-02-14 NOTE — Telephone Encounter (Signed)
Not sure why GYN wouldn't refer. Done (in epic). Please have labs scanned (or fax) so they have access.  Thanks

## 2016-02-14 NOTE — Telephone Encounter (Signed)
Called [facility] to obtain lab results. They will fax.

## 2016-02-14 NOTE — Telephone Encounter (Signed)
Patient was seen at Dr.Adkins office (GYN) had labs drawn, had Factor V Leiden done (runs in family). Asking for a referral to a hematologist to follow up. I asked if they were going to do one and she said no. Is this ok?

## 2016-02-14 NOTE — Telephone Encounter (Signed)
We need copy of lab results before we can do the referral.

## 2016-02-20 ENCOUNTER — Encounter: Payer: Self-pay | Admitting: Oncology

## 2016-02-20 ENCOUNTER — Encounter: Payer: Self-pay | Admitting: Family Medicine

## 2016-02-20 ENCOUNTER — Telehealth: Payer: Self-pay | Admitting: Oncology

## 2016-02-20 NOTE — Telephone Encounter (Signed)
Pt confirmed appt, (will be out of country in [place]) verified demo and insurance, mailed pt letter, and in [employer] referring provider appt date/time

## 2016-03-12 ENCOUNTER — Ambulatory Visit (HOSPITAL_BASED_OUTPATIENT_CLINIC_OR_DEPARTMENT_OTHER): Payer: BC Managed Care – PPO | Admitting: Oncology

## 2016-03-12 VITALS — BP 127/74 | HR 54 | Temp 97.6°F | Resp 14 | Ht 69.0 in | Wt 185.0 lb

## 2016-03-12 DIAGNOSIS — Z8489 Family history of other specified conditions: Secondary | ICD-10-CM | POA: Diagnosis not present

## 2016-03-12 DIAGNOSIS — D6851 Activated protein C resistance: Secondary | ICD-10-CM | POA: Diagnosis not present

## 2016-03-12 DIAGNOSIS — Z85828 Personal history of other malignant neoplasm of skin: Secondary | ICD-10-CM

## 2016-03-12 DIAGNOSIS — Z808 Family history of malignant neoplasm of other organs or systems: Secondary | ICD-10-CM | POA: Diagnosis not present

## 2016-03-12 NOTE — Progress Notes (Signed)
Reason for Referral: Factor V Leiden mutation.   HPI: 50 year old woman currently of [place] for the last 5 and half years but she is originally from [place]. She is a rather healthy woman with history of allergies but no other significant comorbid conditions. Her brother was diagnosed with a deep pain thrombosis and was found to have factor V Leiden mutation her mother was tested for the same mutation and she was positive as well. Based on these findings she was tested for it and the presumably she was told that she has heterozygous factor V Leiden mutation. I was asked to comment about these findings. She never had any blood clots in the past. She denied any deep vein thrombosis, pulmonary embolism or phlebitis. She had 4 pregnancies including one miscarriage and 3 pregnancies were carried to term. No postpartum complications or thrombosis. She had a hernia surgery and multiple long flights without any thrombosis episodes. She has really no complaints at this time. She denied any smoking.  She does not report any headaches, blurry vision, syncope or seizures. She does not report any fevers, chills or sweats. She does not report any chest pain, palpitation, orthopnea or leg edema. She does not report any cough, wheezing or hemoptysis. She does not report any nausea, vomiting or abdominal pain. She does not report any frequency urgency or hesitancy. She does not report skeletal complaints. Remaining review of systems unremarkable.    Her past medical history significant for seasonal allergies. She did have non-melanotic skin cancer removed from her scalp.   Past Surgical History:  Procedure Laterality Date  . HERNIA REPAIR     R inguinal; L inguinal  . WISDOM TOOTH EXTRACTION    :   Current Outpatient Prescriptions:  .  aspirin 81 MG tablet, Take 81 mg by mouth daily., Disp: , Rfl:  .  Calcium Carbonate-Vitamin D3 (CALCIUM 600-D) 600-400 MG-UNIT TABS, Take 2 tablets by mouth daily., Disp: ,  Rfl:  .  cetirizine (ZYRTEC ALLERGY) 10 MG tablet, Take 1 tablet (10 mg total) by mouth daily., Disp: 30 tablet, Rfl: 11 .  fluticasone (FLONASE) 50 MCG/ACT nasal spray, Place 1-2 sprays into both nostrils daily., Disp: 16 g, Rfl: 11 .  meloxicam (MOBIC) 15 MG tablet, Take 1 tablet (15 mg total) by mouth daily., Disp: 30 tablet, Rfl: 3:  No Known Allergies:  Family History  Problem Relation Age of Onset  . Heart disease Mother 1270    MI, has stents x2  . Parkinsonism Father   . Ulcers Brother   . Tongue cancer Brother   :  Social History   Social History  . Marital status: Married    Spouse name: N/A  . Number of children: N/A  . Years of education: N/A   Occupational History  . Teacher's assistant    Social History Main Topics  . Smoking status: Never Smoker  . Smokeless tobacco: Never Used  . Alcohol use 0.0 oz/week     Comment: rarely  . Drug use: No  . Sexual activity: Not on file   Other Topics Concern  . Not on file   Social History Narrative   Architectural technologistteacher's assistant at [employer], lives with husband and 3 children, exercise 5 days per week on elliptical and walking the dogs  :  Pertinent items are noted in HPI.  Exam: Blood pressure 127/74, pulse (!) 54, temperature 97.6 F (36.4 C), temperature source Oral, resp. rate 14, height 5\' 9"  (1.753 m), weight 185 lb (83.9  kg), last menstrual period 11/19/2012, SpO2 100 %.  ECOG 0 General appearance: alert and cooperative appeared without distress. Head: Normocephalic, without obvious abnormality Throat: lips, mucosa, and tongue normal; teeth and gums normal Neck: no adenopathy Back: negative Resp: clear to auscultation bilaterally Chest wall: no tenderness Cardio: regular rate and rhythm, S1, S2 normal, no murmur, click, rub or gallop GI: soft, non-tender; bowel sounds normal; no masses,  no organomegaly Extremities: extremities normal, atraumatic, no cyanosis or edema Pulses: 2+ and symmetric Skin: Skin  color, texture, turgor normal. No rashes or lesions Lymph nodes: Cervical, supraclavicular, and axillary nodes normal.  CBC    Component Value Date/Time   WBC 5.9 08/12/2011 1006   RBC 4.40 08/12/2011 1006   HGB 12.8 08/12/2011 1006   HCT 40.5 08/12/2011 1006   PLT 195 08/12/2011 1006   MCV 92.0 08/12/2011 1006   MCH 29.1 08/12/2011 1006   MCHC 31.6 08/12/2011 1006   RDW 12.8 08/12/2011 1006   LYMPHSABS 2.0 08/12/2011 1006   MONOABS 0.5 08/12/2011 1006   EOSABS 0.0 08/12/2011 1006   BASOSABS 0.0 08/12/2011 1006     Chemistry      Component Value Date/Time   NA 142 08/12/2011 1006   K 4.5 08/12/2011 1006   CL 108 08/12/2011 1006   CO2 27 08/12/2011 1006   BUN 23 08/12/2011 1006   CREATININE 0.98 08/12/2011 1006      Component Value Date/Time   CALCIUM 9.5 08/12/2011 1006   ALKPHOS 32 (L) 08/12/2011 1006   AST 14 08/12/2011 1006   ALT 9 08/12/2011 1006   BILITOT 0.4 08/12/2011 1006       Assessment and Plan:   50 year old woman with the following issues:  1. Heterozygous factor V Leiden mutation detected in an asymptomatic individual with family history of deep vein thrombosis. The implication of this finding was discussed with the patient extensively. Her risk of thrombosis is increased slightly compared to the general population. The fact that she have not had any blood clots in the past even during pregnancy and the use of oral contraceptives puts her low risk at this time.  I see no indication for stronger anticoagulation at this time unless she develops deep vein thrombosis. I counseled her today about strategies to prevent situations that increases her risk of thrombosis. That would include adequate hydration, low-dose aspirin and adequate stretching during long flights. Early mobilization after surgeries is also effective strategy. If she develops deep vein thrombosis or pulmonary embolism she would be treated with stronger anticoagulation regardless of her factor V  Leiden mutation status.  All her questions were answered to her satisfaction.  2. Skin cancer: Nonmelanoma in nature. We have discussed different strategies to protect her skin to prevent Lovenox skin cancer.  3. Follow-up: Am happy to see her in the future as needed.

## 2016-06-11 ENCOUNTER — Telehealth: Payer: Self-pay

## 2016-06-11 NOTE — Telephone Encounter (Signed)
Pt dropped off some lab results from applying from life insurance policy that they told her to have you review. I have put this in your folder on my desk. I will pass back tomorrow. Please note any recommendation and pt states if appt is needed then she would be happy to do that as well. [phone]. Trixie Rude/RLB

## 2016-06-12 NOTE — Telephone Encounter (Signed)
Patient advised.

## 2016-06-12 NOTE — Telephone Encounter (Signed)
Advise pt that I reviewed her labs.  Lipids were good.  Creatinine was slightly elevated--last checked here in 2013 and was normal.  We should keep an eye on this (check it at her next physical--it doesn't look like she has one scheduled, and should schedule one). Be sure to stay well hydrated, and avoid excessive use of anti-inflammatories such as advil/motrin/ibuprofen/aleve, which can hurt the kidneys.   I'm not worried about the other highlighted areas (low urine creatinine, low GGT)--I only worry about these if they are elevated.

## 2016-07-01 ENCOUNTER — Telehealth: Payer: Self-pay | Admitting: Family Medicine

## 2016-07-01 NOTE — Telephone Encounter (Signed)
Pt called and left message that she has questions about her last visit with Dr. Lynelle DoctorKnapp .  Please call [phone]

## 2016-07-01 NOTE — Telephone Encounter (Signed)
Spoke with patient and answered all of her questions.

## 2016-07-31 ENCOUNTER — Encounter: Payer: Self-pay | Admitting: Family Medicine

## 2016-12-02 ENCOUNTER — Other Ambulatory Visit: Payer: Self-pay | Admitting: Family Medicine

## 2016-12-02 DIAGNOSIS — J302 Other seasonal allergic rhinitis: Secondary | ICD-10-CM

## 2016-12-02 NOTE — Telephone Encounter (Signed)
Pt aware needs to schedule appt. 30 day supply sent in.

## 2017-01-07 ENCOUNTER — Other Ambulatory Visit: Payer: Self-pay | Admitting: Family Medicine

## 2017-01-07 DIAGNOSIS — J302 Other seasonal allergic rhinitis: Secondary | ICD-10-CM

## 2017-01-07 NOTE — Telephone Encounter (Signed)
Is this okay to refill? I do not see pt has ever had a med check or CPE here before.

## 2017-01-07 NOTE — Telephone Encounter (Signed)
Deny. She hasn't been seen in over a year, and this is a medication she can get over-the-counter.  If she wants prescription from me, she needs to have visit here.  Looks like she may have been advised of this last month, as Lurena Joiner only filled #30 last month. She still hasn't scheduled any OV or CPE

## 2017-01-07 NOTE — Telephone Encounter (Signed)
Pt was notified that med would be denied as she needed an appt has its been over a year and pt declines making an appointment right now as she does not have time to come in.

## 2017-03-28 ENCOUNTER — Other Ambulatory Visit: Payer: Self-pay | Admitting: Obstetrics and Gynecology

## 2017-03-28 DIAGNOSIS — R928 Other abnormal and inconclusive findings on diagnostic imaging of breast: Secondary | ICD-10-CM

## 2017-04-03 ENCOUNTER — Ambulatory Visit
Admission: RE | Admit: 2017-04-03 | Discharge: 2017-04-03 | Disposition: A | Discharge status: Home / Self Care | Payer: BC Managed Care – PPO | Source: Ambulatory Visit | Attending: Obstetrics and Gynecology | Admitting: Obstetrics and Gynecology

## 2017-04-03 DIAGNOSIS — R928 Other abnormal and inconclusive findings on diagnostic imaging of breast: Secondary | ICD-10-CM

## 2017-07-02 ENCOUNTER — Ambulatory Visit: Payer: BC Managed Care – PPO | Admitting: Family Medicine

## 2017-07-02 ENCOUNTER — Encounter: Payer: Self-pay | Admitting: Family Medicine

## 2017-07-02 VITALS — BP 122/82 | HR 60 | Ht 69.0 in | Wt 186.0 lb

## 2017-07-02 DIAGNOSIS — R1310 Dysphagia, unspecified: Secondary | ICD-10-CM

## 2017-07-02 DIAGNOSIS — R1319 Other dysphagia: Secondary | ICD-10-CM

## 2017-07-02 NOTE — Progress Notes (Signed)
Chief Complaint  Patient presents with  . Heartburn    more frequent heartburn over the last few weeks.    Symptoms when eating--food gets caught, feels like it goes up and down, causing pain, hard to catch her breath.  It eventually goes away--sitting forward, trying to get it to pass.    She was last seen here in 11/2015--with complaints of sporadic pain between her breasts with eating, food getting hung up. Took prilosec x 2 weeks, and seemed to get better. She started having recurrent problems 6 months ago, but very sporadic.  She has now had it 3 times in the last 2 weeks. Twice it occurred while eating Mal[place]key sandwich, and this morning while eating oatmeal. Occasionally happened with other foods, very random.   She denies heartburn.    She had Barium swallow in 11/2015, which showed: IMPRESSION: 1. Small sliding hiatal hernia. 2. Intermittent tertiary peristaltic activity and intermittent reversed peristalsis in the distal esophagus. 3. No stricture, mass or ulceration.  2 months ago she was very gassy (passing much more gas than normal).  Lasted for 2 months, none in the last 3 weeks. No change in diet. No change in stools. No nausea, vomiting.  Had colonoscopy age 52 (referred by GYN Dr. Renaldo FiddlerAdkins, no results in our system--therefore not Dr Juanda ChanceBrodie, must be Dr. Loreta AveMann, as she recalls it was a female physician).  PMH, PSH, SH reviewed  Outpatient Encounter Medications as of 07/02/2017  Medication Sig  . Calcium Carbonate-Vitamin D3 (CALCIUM 600-D) 600-400 MG-UNIT TABS Take 2 tablets by mouth daily.  . cetirizine (ZYRTEC) 10 MG tablet TAKE 1 TABLET(10 MG) BY MOUTH DAILY  . cholecalciferol (VITAMIN D) 1000 units tablet Take 1,000 Units by mouth daily.  . [DISCONTINUED] aspirin 81 MG tablet Take 81 mg by mouth daily.  . [DISCONTINUED] fluticasone (FLONASE) 50 MCG/ACT nasal spray Place 1-2 sprays into both nostrils daily.  . [DISCONTINUED] meloxicam (MOBIC) 15 MG tablet Take 1 tablet (15  mg total) by mouth daily.   No facility-administered encounter medications on file as of 07/02/2017.    No Known Allergies  ROS: no fever, chills, allergies controlled, no URI symptoms. No nausea, vomiting, diarrhea or changes in bowels.  Gassiness resolved.   Some stress--in process [place], seeing husband only on weekends (who [place] [facility] [place] [facility] with their other daughter).   PHYSICAL EXAM: BP 122/82   Pulse 60   Ht 5\' 9"  (1.753 m)   Wt 186 lb (84.4 kg)   LMP 11/19/2012   BMI 27.47 kg/m   Wt Readings from Last 3 Encounters:  07/02/17 186 lb (84.4 kg)  03/12/16 185 lb (83.9 kg)  11/16/15 185 lb 3.2 oz (84 kg)   Well appearing, pleasant female in no distress Neck: no lymphadenopathy or mass Heart: regular rate and rhythm, no murmur Lungs: clear bilaterally Chest wall: nontender to palpation Abdomen: soft, nontender, no organomegaly or mass, no epigastric tenderness Extremities: no edema   ASSESSMENT/PLAN:  Esophageal dysphagia - refer back to GI for further eval, given abnormal peristaltic findings on UGI in past.   Request colonoscopy (age 52)--check with [facility] Dr. Loreta AveMann

## 2017-07-03 ENCOUNTER — Telehealth: Payer: Self-pay | Admitting: Family Medicine

## 2017-07-03 DIAGNOSIS — J302 Other seasonal allergic rhinitis: Secondary | ICD-10-CM

## 2017-07-03 MED ORDER — CETIRIZINE HCL 10 MG PO TABS: ORAL_TABLET | ORAL | 0 refills | Status: AC

## 2017-07-03 NOTE — Telephone Encounter (Signed)
Done

## 2017-07-03 NOTE — Telephone Encounter (Signed)
[facility]   Pt called and stated she forgot to mention she needed a refill on cetirizine while in office. She is requesting that go to DIFFERENT PHARMACY.  [facility] [address]. Pt can be reached at [phone]

## 2017-07-09 ENCOUNTER — Encounter: Payer: Self-pay | Admitting: *Deleted

## 2017-07-18 HISTORY — PX: ESOPHAGOGASTRODUODENOSCOPY: SHX1529

## 2017-07-21 ENCOUNTER — Encounter: Payer: Self-pay | Admitting: *Deleted

## 2017-07-22 ENCOUNTER — Encounter: Payer: Self-pay | Admitting: Family Medicine

## 2017-07-29 ENCOUNTER — Encounter: Payer: Self-pay | Admitting: Family Medicine

## 2017-08-25 ENCOUNTER — Encounter: Payer: Self-pay | Admitting: Family Medicine

## 2017-09-02 ENCOUNTER — Ambulatory Visit: Payer: BC Managed Care – PPO | Admitting: Podiatry

## 2017-09-02 ENCOUNTER — Encounter: Payer: Self-pay | Admitting: Podiatry

## 2017-09-02 ENCOUNTER — Other Ambulatory Visit: Payer: Self-pay | Admitting: Podiatry

## 2017-09-02 ENCOUNTER — Ambulatory Visit (INDEPENDENT_AMBULATORY_CARE_PROVIDER_SITE_OTHER): Payer: BC Managed Care – PPO

## 2017-09-02 DIAGNOSIS — M21619 Bunion of unspecified foot: Secondary | ICD-10-CM

## 2017-09-02 DIAGNOSIS — M2012 Hallux valgus (acquired), left foot: Secondary | ICD-10-CM

## 2017-09-02 NOTE — Patient Instructions (Signed)
Pre-Operative Instructions  Congratulations, you have decided to take an important step towards improving your quality of life.  You can be assured that the doctors and staff at [facility] will be with you every step of the way.  Here are some important things you should know:  1. Plan to be at the surgery center/hospital at least 1 (one) hour prior to your scheduled time, unless otherwise directed by the surgical center/hospital staff.  You must have a responsible adult accompany you, remain during the surgery and drive you home.  Make sure you have directions to the surgical center/hospital to ensure you arrive on time. 2. If you are having surgery at [facility] or [facility], you will need a copy of your medical history and physical form from your family physician within one month prior to the date of surgery. We will give you a form for your primary physician to complete.  3. We make every effort to accommodate the date you request for surgery.  However, there are times where surgery dates or times have to be moved.  We will contact you as soon as possible if a change in schedule is required.   4. No aspirin/ibuprofen for one week before surgery.  If you are on aspirin, any non-steroidal anti-inflammatory medications (Mobic, Aleve, Ibuprofen) should not be taken seven (7) days prior to your surgery.  You make take Tylenol for pain prior to surgery.  5. Medications - If you are taking daily heart and blood pressure medications, seizure, reflux, allergy, asthma, anxiety, pain or diabetes medications, make sure you notify the surgery center/hospital before the day of surgery so they can tell you which medications you should take or avoid the day of surgery. 6. No food or drink after midnight the night before surgery unless directed otherwise by surgical center/hospital staff. 7. No alcoholic beverages 24-hours prior to surgery.  No smoking 24-hours prior or 24-hours after  surgery. 8. Wear loose pants or shorts. They should be loose enough to fit over bandages, boots, and casts. 9. Don't wear slip-on shoes. Sneakers are preferred. 10. Bring your boot with you to the surgery center/hospital.  Also bring crutches or a walker if your physician has prescribed it for you.  If you do not have this equipment, it will be provided for you after surgery. 11. If you have not been contacted by the surgery center/hospital by the day before your surgery, call to confirm the date and time of your surgery. 12. Leave-time from work may vary depending on the type of surgery you have.  Appropriate arrangements should be made prior to surgery with your employer. 13. Prescriptions will be provided immediately following surgery by your doctor.  Fill these as soon as possible after surgery and take the medication as directed. Pain medications will not be refilled on weekends and must be approved by the doctor. 14. Remove nail polish on the operative foot and avoid getting pedicures prior to surgery. 15. Wash the night before surgery.  The night before surgery wash the foot and leg well with water and the antibacterial soap provided. Be sure to pay special attention to beneath the toenails and in between the toes.  Wash for at least three (3) minutes. Rinse thoroughly with water and dry well with a towel.  Perform this wash unless told not to do so by your physician.  Enclosed: 1 Ice pack (please put in freezer the night before surgery)   1 Hibiclens skin cleaner     Pre-op instructions  If you have any questions regarding the instructions, please do not hesitate to call our office.  [place]: [address], [place], [place] [address] -- [phone]  [place]: [address] [place], [place] [address] -- [phone]  [place]: [address]  [place], [place] [address] -- [phone]  [place]: [address], [address], [place], [place] [address] -- [phone]  Website:  [email or web address] 

## 2017-09-03 NOTE — Progress Notes (Signed)
She presents today for chief complaint of a painful first metatarsophalangeal joint of the left foot.  States that she had bunion surgery on this bunion 12 [place] ago [place] and is starting to hurt again.  She states that she did not know that they could come back.  I also corrected the bunion on her right foot and she is very happy with that one and it has not come back over the years.  Objective: Vital signs are stable she is alert and oriented x3 I reviewed her past medical history medications allergy surgeries and social history without changes.  Her pulses are strongly palpable neurologic sensorium is intact deep tendon reflexes are intact muscle strength is normal symmetrical bilateral.  Orthopedic evaluation demonstrates hallux abductovalgus deformity of the left foot only right foot appears to be good.  Radiographs taken today do demonstrate 2 screws one in the proximal phalanx of the hallux the other in an oblique attitude from proximal medial dorsal to distal lateral plantar.  She has some osteoarthritic changes of the joint.  But otherwise there is a significant increase in the first intermetatarsal angle greater than normal value.  There is has already been some shortening of the first metatarsal.  Assessment: Hallux abductovalgus deformity of the left foot.  Plan: Discussed etiology pathology conservative versus surgical therapies.  At this point she would like to have this surgically corrected due to the pain that is associated with it.  We consented her today for a [facility] bunion repair with single screw fixation.  She would like to go ahead and have this done we also consented her for removal of screw of the head of the first metatarsal.  This will be necessary in order to perform the [facility].  Provided her with a Cam walker.  Discussed the possible postop complications which may include but are not limited to postop pain bleeding swelling infection recurrence need for further surgery  shortening of the toe overcorrection under correction loss of digit loss of limb loss of life.  She was provided both oral and written home-going instructions for preop at the surgery center as well as anesthesia group.

## 2017-09-10 ENCOUNTER — Other Ambulatory Visit: Payer: Self-pay | Admitting: Podiatry

## 2017-09-10 MED ORDER — ONDANSETRON HCL 4 MG PO TABS: 4.0000 mg | ORAL_TABLET | Freq: Three times a day (TID) | ORAL | 0 refills | Status: DC | PRN

## 2017-09-10 MED ORDER — CEPHALEXIN 500 MG PO CAPS: 500.0000 mg | ORAL_CAPSULE | Freq: Three times a day (TID) | ORAL | 0 refills | Status: DC

## 2017-09-10 MED ORDER — OXYCODONE-ACETAMINOPHEN 10-325 MG PO TABS: 1.0000 | ORAL_TABLET | Freq: Four times a day (QID) | ORAL | 0 refills | Status: AC | PRN

## 2017-09-12 ENCOUNTER — Encounter: Payer: Self-pay | Admitting: Podiatry

## 2017-09-12 ENCOUNTER — Telehealth: Payer: Self-pay | Admitting: *Deleted

## 2017-09-12 DIAGNOSIS — M2012 Hallux valgus (acquired), left foot: Secondary | ICD-10-CM | POA: Diagnosis not present

## 2017-09-12 DIAGNOSIS — Z4889 Encounter for other specified surgical aftercare: Secondary | ICD-10-CM | POA: Diagnosis not present

## 2017-09-12 NOTE — Telephone Encounter (Signed)
[facility] - request clarification of surgery for the percocet. I called the [facility] and the staff states pt has already gotten the medication.

## 2017-09-15 ENCOUNTER — Telehealth: Payer: Self-pay | Admitting: *Deleted

## 2017-09-15 NOTE — Telephone Encounter (Signed)
L/M FOR PATIENT TO CALL IF SHE HAD ANY PROBLEMS OR CONCERNS BEFORE HER FIRST POV APPT ON THURS.

## 2017-09-18 ENCOUNTER — Ambulatory Visit (INDEPENDENT_AMBULATORY_CARE_PROVIDER_SITE_OTHER): Payer: Self-pay | Admitting: Podiatry

## 2017-09-18 ENCOUNTER — Encounter: Payer: Self-pay | Admitting: Podiatry

## 2017-09-18 ENCOUNTER — Ambulatory Visit (INDEPENDENT_AMBULATORY_CARE_PROVIDER_SITE_OTHER): Payer: BC Managed Care – PPO

## 2017-09-18 VITALS — BP 123/82 | HR 79

## 2017-09-18 DIAGNOSIS — M7662 Achilles tendinitis, left leg: Secondary | ICD-10-CM

## 2017-09-18 DIAGNOSIS — M2012 Hallux valgus (acquired), left foot: Secondary | ICD-10-CM

## 2017-09-18 NOTE — Progress Notes (Signed)
She presents today for her first postop visit date of surgery 09/12/2017 status post [facility] bunion[facility] with screw fixation left after removing the screw from the first metatarsal from her previous bunion surgery.  She states that her foot feels fine she does not need any pain medication.  Objective: Vital signs are stable she is alert and oriented x3.  Dry sterile dressing intact was removed demonstrates no erythematous mild edema no sialitis drainage or odor sutures are intact margins well coapted she has great range of motion passive and active the first metatarsophalangeal joint.  Radiographs taken today do demonstrate good position of the capital osteotomy with screw fixation.  Assessment: Well-healing surgical foot status post 1 week [place] bunion repair with screw fixation left.  Plan: Redressed today dresser compressive dressing continue minimal weightbearing and encourage range of motion exercises.  Continue to keep this dry.  Follow-up with us in 1 week at which time we hope to be able to remove sutures.

## 2017-09-25 ENCOUNTER — Ambulatory Visit (INDEPENDENT_AMBULATORY_CARE_PROVIDER_SITE_OTHER): Payer: BC Managed Care – PPO | Admitting: Podiatry

## 2017-09-25 DIAGNOSIS — M2012 Hallux valgus (acquired), left foot: Secondary | ICD-10-CM

## 2017-09-25 DIAGNOSIS — M7662 Achilles tendinitis, left leg: Secondary | ICD-10-CM

## 2017-09-25 DIAGNOSIS — M722 Plantar fascial fibromatosis: Secondary | ICD-10-CM

## 2017-09-25 NOTE — Progress Notes (Signed)
She presents today for another postop visit date of surgery 09/12/2017 status post [facility] bunionectomy with removal deep internal fixation first metatarsal phalangeal joint of the left foot.  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  She states that she continues to wear her boot at all times.  Objective: Vital signs are stable she is alert and oriented x3 dry sterile dressing intact was removed demonstrates margins well coapted minimal edema no erythema cellulitis drainage or odor.  She has a stiff range of motion of the dorsiflexion and plantarflexion of the first metatarsophalangeal joint left.  Sutures were removed today.  Assessment: Well-healing surgical foot I encouraged range of motion activity redressed today with a compression anklet and a Darco shoe will allow her to start getting this wet and I will follow-up with her in 1 week.

## 2017-10-09 ENCOUNTER — Ambulatory Visit (INDEPENDENT_AMBULATORY_CARE_PROVIDER_SITE_OTHER): Payer: BC Managed Care – PPO

## 2017-10-09 ENCOUNTER — Ambulatory Visit (INDEPENDENT_AMBULATORY_CARE_PROVIDER_SITE_OTHER): Payer: BC Managed Care – PPO | Admitting: Podiatry

## 2017-10-09 ENCOUNTER — Encounter: Payer: Self-pay | Admitting: Podiatry

## 2017-10-09 DIAGNOSIS — M2012 Hallux valgus (acquired), left foot: Secondary | ICD-10-CM

## 2017-10-09 DIAGNOSIS — Z9889 Other specified postprocedural states: Secondary | ICD-10-CM

## 2017-10-09 DIAGNOSIS — T8484XA Pain due to internal orthopedic prosthetic devices, implants and grafts, initial encounter: Secondary | ICD-10-CM

## 2017-10-12 NOTE — Progress Notes (Signed)
She presents today for her postop visit date of surgery 09/12/2017 status post [facility] bunionectomy left foot removal internal fixation K wire first metatarsal left foot.  States that her foot is a little tender on the medial side of the big toe however is doing much better and it feels better than it did prior to surgery.  She denies fever chills nausea vomiting muscle aches pains denies calf pain chest pain shortness of breath.  Objective: Vital signs are stable she is alert and oriented x3 minimal edema no erythema cellulitis drainage or odor to the surgical foot.  Incision site is gone on to heal uneventfully she has great range of motion of the first metatarsophalangeal joint radiographs taken today demonstrate well healing osteotomy.  Assessment: Well-healing [place] bunion repair.  Plan: Discussed etiology pathology conservative versus surgical therapies at this point I encouraged her to continue range of motion exercises and to try to get back into loose fitting shoe gear with a stiff sole.  She will transition from her Darco shoe to that type shoe and will follow-up with me in approximately 1 month if necessary.  She will call with questions or concerns.

## 2017-11-06 ENCOUNTER — Ambulatory Visit (INDEPENDENT_AMBULATORY_CARE_PROVIDER_SITE_OTHER): Payer: BC Managed Care – PPO

## 2017-11-06 ENCOUNTER — Encounter: Payer: Self-pay | Admitting: Podiatry

## 2017-11-06 ENCOUNTER — Ambulatory Visit (INDEPENDENT_AMBULATORY_CARE_PROVIDER_SITE_OTHER): Payer: BC Managed Care – PPO | Admitting: Podiatry

## 2017-11-06 DIAGNOSIS — T8484XA Pain due to internal orthopedic prosthetic devices, implants and grafts, initial encounter: Secondary | ICD-10-CM | POA: Diagnosis not present

## 2017-11-06 DIAGNOSIS — Z9889 Other specified postprocedural states: Secondary | ICD-10-CM

## 2017-11-06 DIAGNOSIS — M2012 Hallux valgus (acquired), left foot: Secondary | ICD-10-CM

## 2017-11-06 NOTE — Progress Notes (Signed)
She presents today for her final postop visit date of surgery Sep 12, 2017 [facility] and [place] oste[facility].  States that is doing much better.  She is very happy with she basically can do anything that she wants to do.  Objective: Vital signs are stable she is alert and oriented x3 there is no erythema no edema cellulitis drainage or odor incision site is gone on to heal uneventfully.  She has great range of motion of the first metatarsophalangeal joint left.  Radiographs taken today left foot demonstrate a well-healed metatarsal osteotomy and Akin osteotomy.  Assessment: Well-healing surgical foot.  Plan: We will follow-up with her on an as-needed basis.

## 2018-02-10 IMAGING — RF DG ESOPHAGUS
17 series · 17 of 17 positions shown · non-contrast
Comparison: Chest radiographs dated 10/26/2014.

CLINICAL DATA: Mid chest pain when eating solids for several weeks.
Clinical concern for esophagitis, esophageal spasm or dysmotility.

EXAM:
ESOPHOGRAM/BARIUM SWALLOW
TECHNIQUE: Combined double contrast and single contrast examination performed
using effervescent crystals, thick barium liquid, and thin barium
liquid.
FLUOROSCOPY TIME:  Radiation Exposure Index (as provided by the
fluoroscopic device): 25 d Gy cm2
If the device does not provide the exposure index:
Fluoroscopy Time:  1 minute 42 seconds
Number of Acquired Images:  17

[Series 1: run · 1 of 1 slices shown (1 of 17)]
[im 1/1]
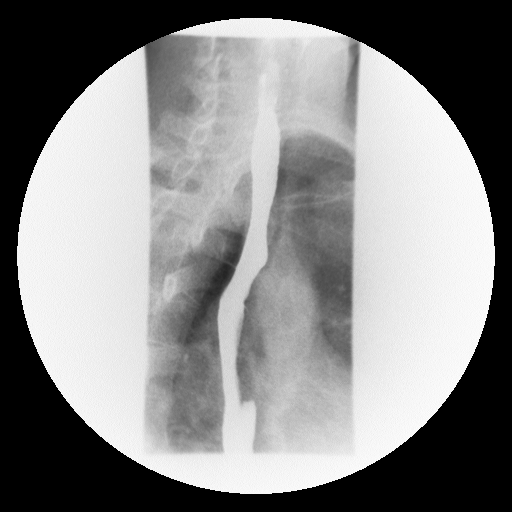

[Series 2: run · 1 of 1 slices shown (2 of 17)]
[im 1/1]
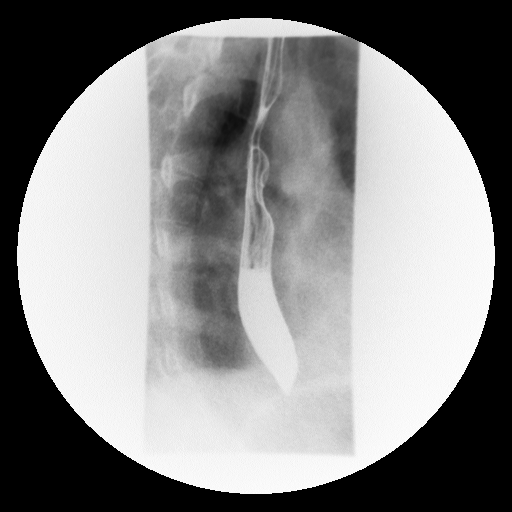

[Series 3: run · 1 of 1 slices shown (3 of 17)]
[im 1/1]
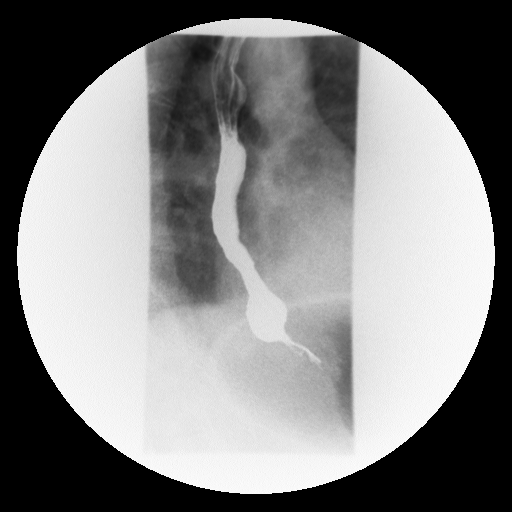

[Series 4: run · 1 of 1 slices shown (4 of 17)]
[im 1/1]
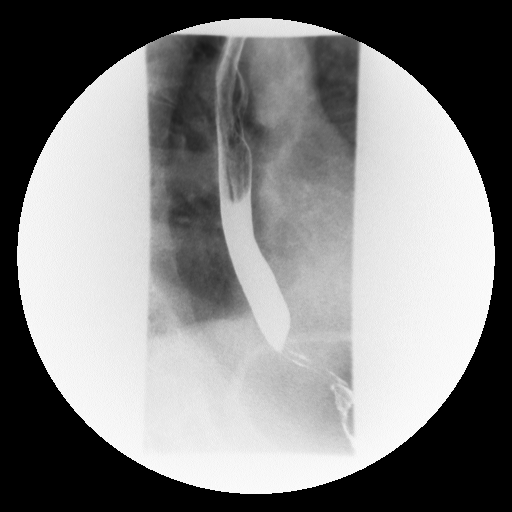

[Series 5: run · 1 of 1 slices shown (5 of 17)]
[im 1/1]
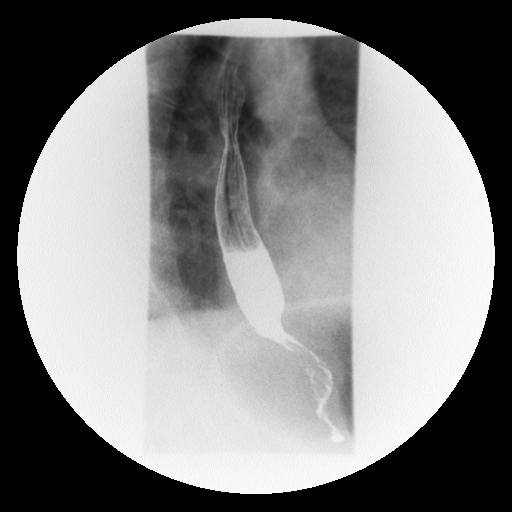

[Series 6: run · 1 of 1 slices shown (6 of 17)]
[im 1/1]
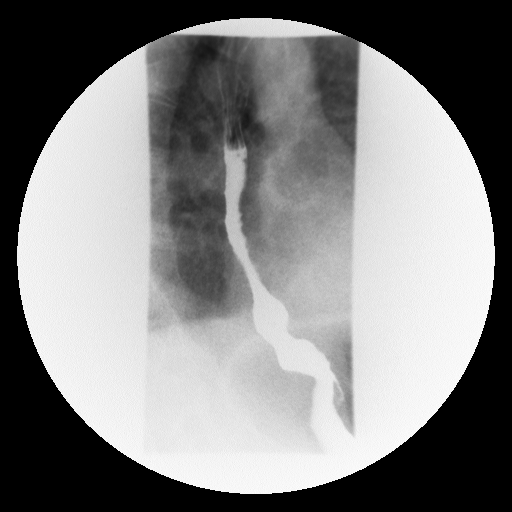

[Series 7: run · 1 of 1 slices shown (7 of 17)]
[im 1/1]
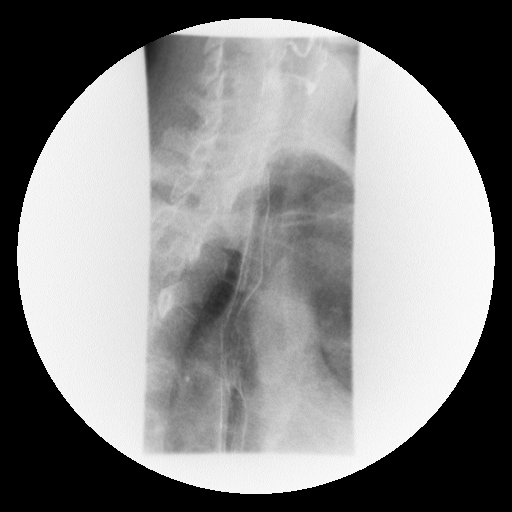

[Series 8: run · 1 of 1 slices shown (8 of 17)]
[im 1/1]
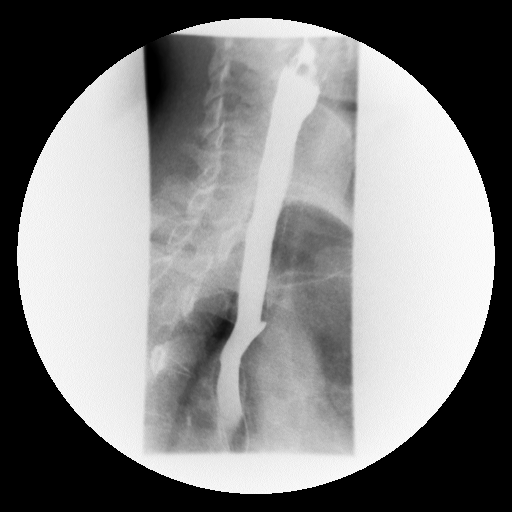

[Series 9: run · 1 of 1 slices shown (9 of 17)]
[im 1/1]
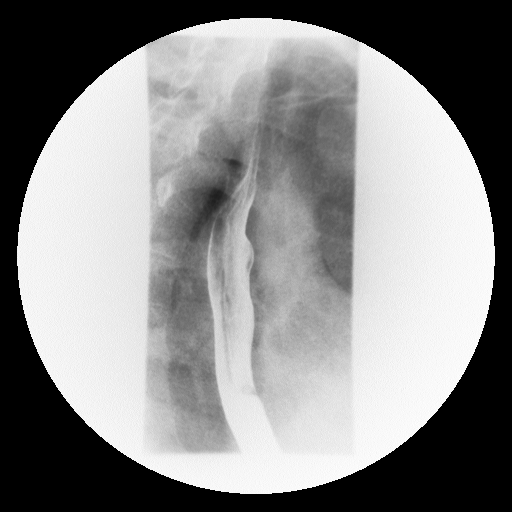

[Series 10: run · 1 of 1 slices shown (10 of 17)]
[im 1/1]
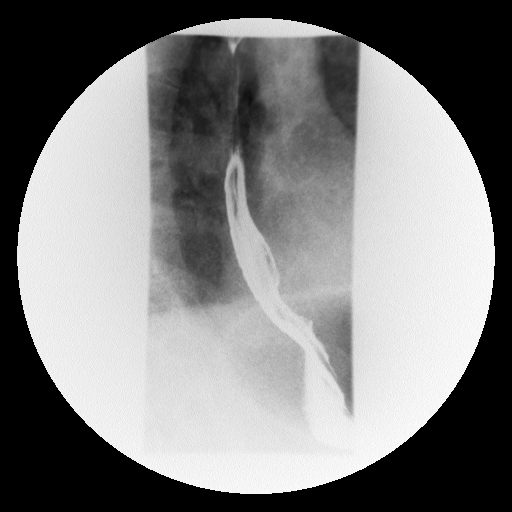

[Series 11: run · 1 of 1 slices shown (11 of 17)]
[im 1/1]
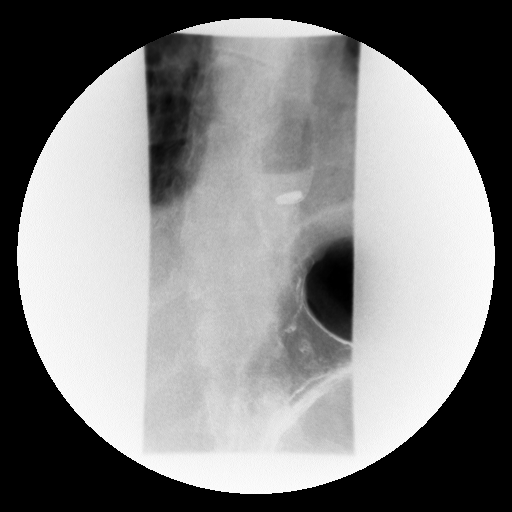

[Series 12: run · 1 of 1 slices shown (12 of 17)]
[im 1/1]
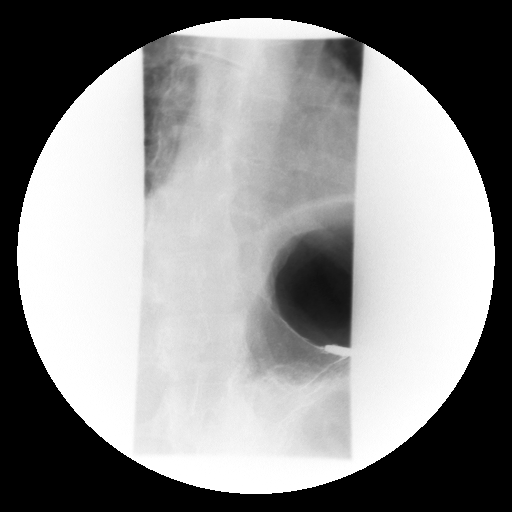

[Series 13: run · 1 of 1 slices shown (13 of 17)]
[im 1/1]
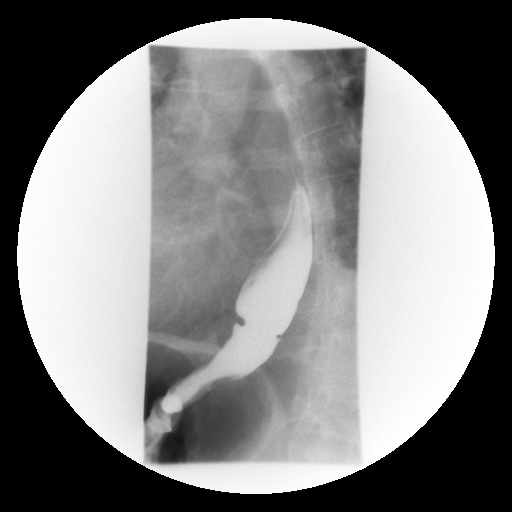

[Series 14: run · 1 of 1 slices shown (14 of 17)]
[im 1/1]
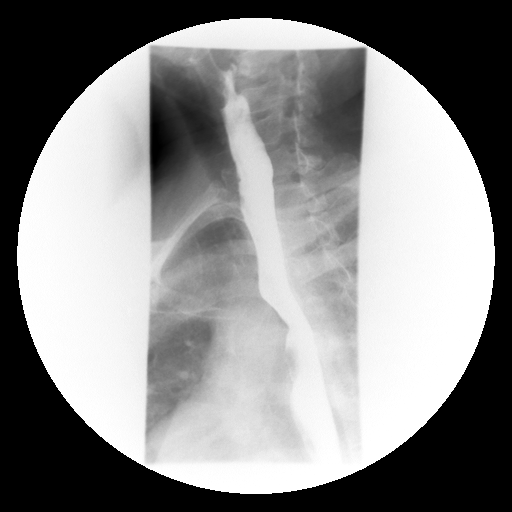

[Series 15: run · 1 of 1 slices shown (15 of 17)]
[im 1/1]
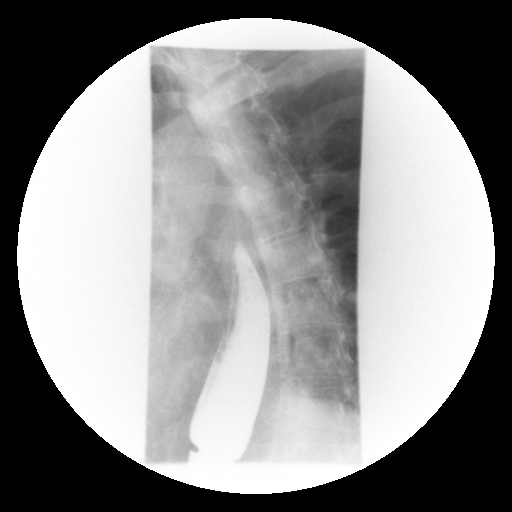

[Series 16: run · 1 of 1 slices shown (16 of 17)]
[im 1/1]
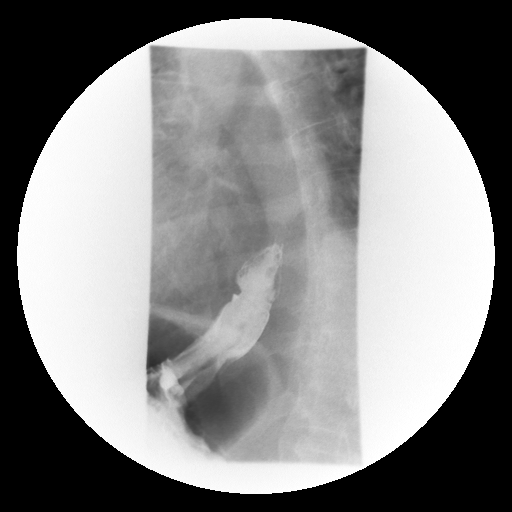

[Series 17: run · 1 of 1 slices shown (17 of 17)]
[im 1/1]
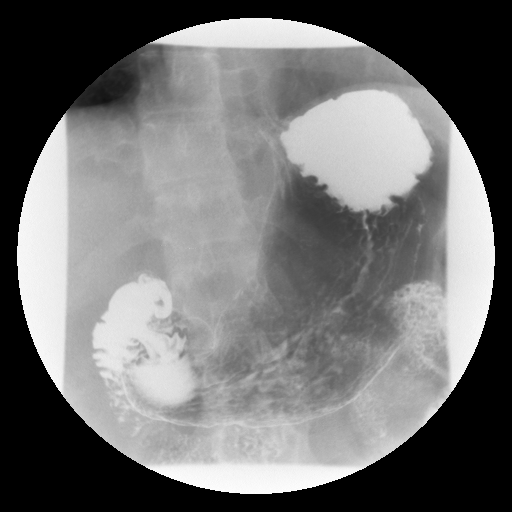

[17 of 17 positions shown; findings below may reference images not displayed]

FINDINGS: The patient swallowed barium without difficulty. Normal primary
esophageal peristalsis was demonstrated. There was intermittent
tertiary peristaltic activity and intermittent reversed peristalsis
in the distal half of the esophagus. Otherwise, normal motility was
demonstrated.

A small sliding hiatal hernia was demonstrated. Otherwise, the
esophagus has a normal appearance. No strictures, masses or
ulcerations were seen. The patient swallowed a 12.5 mm in diameter
barium tablet without difficulty. This passed normally through the
esophagus and into the stomach.
IMPRESSION: 1. Small sliding hiatal hernia.
2. Intermittent tertiary peristaltic activity and intermittent
reversed peristalsis in the distal esophagus.
3. No stricture, mass or ulceration.
